# Patient Record
Sex: Male | Born: 1960 | Race: White | Hispanic: No | Marital: Married | Smoking: Current every day smoker
Health system: Southern US, Community
[De-identification: ages and names within clinical notes are randomized; demographics above are authoritative.]

## PROBLEM LIST (undated history)

## (undated) DIAGNOSIS — I639 Cerebral infarction, unspecified: Secondary | ICD-10-CM

## (undated) DIAGNOSIS — I10 Essential (primary) hypertension: Secondary | ICD-10-CM

## (undated) DIAGNOSIS — G243 Spasmodic torticollis: Secondary | ICD-10-CM

## (undated) DIAGNOSIS — M51369 Other intervertebral disc degeneration, lumbar region without mention of lumbar back pain or lower extremity pain: Secondary | ICD-10-CM

## (undated) DIAGNOSIS — M5136 Other intervertebral disc degeneration, lumbar region: Secondary | ICD-10-CM

## (undated) HISTORY — PX: SPINAL CORD STIMULATOR IMPLANT: SHX2422

---

## 2013-09-07 ENCOUNTER — Observation Stay (HOSPITAL_COMMUNITY)
Admission: EM | Admit: 2013-09-07 | Discharge: 2013-09-08 | Disposition: A | Discharge status: Home / Self Care | Payer: Medicare Other | Attending: Internal Medicine | Admitting: Internal Medicine

## 2013-09-07 ENCOUNTER — Emergency Department (HOSPITAL_COMMUNITY): Payer: Medicare Other

## 2013-09-07 ENCOUNTER — Encounter (HOSPITAL_COMMUNITY): Payer: Self-pay | Admitting: Emergency Medicine

## 2013-09-07 DIAGNOSIS — R209 Unspecified disturbances of skin sensation: Secondary | ICD-10-CM | POA: Diagnosis not present

## 2013-09-07 DIAGNOSIS — M51379 Other intervertebral disc degeneration, lumbosacral region without mention of lumbar back pain or lower extremity pain: Secondary | ICD-10-CM | POA: Insufficient documentation

## 2013-09-07 DIAGNOSIS — Z8673 Personal history of transient ischemic attack (TIA), and cerebral infarction without residual deficits: Secondary | ICD-10-CM | POA: Insufficient documentation

## 2013-09-07 DIAGNOSIS — M5136 Other intervertebral disc degeneration, lumbar region: Secondary | ICD-10-CM | POA: Diagnosis present

## 2013-09-07 DIAGNOSIS — F17200 Nicotine dependence, unspecified, uncomplicated: Secondary | ICD-10-CM | POA: Diagnosis not present

## 2013-09-07 DIAGNOSIS — M51369 Other intervertebral disc degeneration, lumbar region without mention of lumbar back pain or lower extremity pain: Secondary | ICD-10-CM

## 2013-09-07 DIAGNOSIS — M5137 Other intervertebral disc degeneration, lumbosacral region: Secondary | ICD-10-CM | POA: Insufficient documentation

## 2013-09-07 DIAGNOSIS — M5382 Other specified dorsopathies, cervical region: Secondary | ICD-10-CM | POA: Insufficient documentation

## 2013-09-07 DIAGNOSIS — Z79899 Other long term (current) drug therapy: Secondary | ICD-10-CM | POA: Insufficient documentation

## 2013-09-07 DIAGNOSIS — R61 Generalized hyperhidrosis: Secondary | ICD-10-CM | POA: Insufficient documentation

## 2013-09-07 DIAGNOSIS — R5381 Other malaise: Secondary | ICD-10-CM | POA: Insufficient documentation

## 2013-09-07 DIAGNOSIS — Z720 Tobacco use: Secondary | ICD-10-CM | POA: Diagnosis present

## 2013-09-07 DIAGNOSIS — R1310 Dysphagia, unspecified: Secondary | ICD-10-CM | POA: Diagnosis not present

## 2013-09-07 DIAGNOSIS — G459 Transient cerebral ischemic attack, unspecified: Secondary | ICD-10-CM

## 2013-09-07 DIAGNOSIS — R5383 Other fatigue: Secondary | ICD-10-CM | POA: Diagnosis present

## 2013-09-07 DIAGNOSIS — G243 Spasmodic torticollis: Secondary | ICD-10-CM

## 2013-09-07 DIAGNOSIS — I10 Essential (primary) hypertension: Secondary | ICD-10-CM

## 2013-09-07 DIAGNOSIS — R202 Paresthesia of skin: Secondary | ICD-10-CM

## 2013-09-07 DIAGNOSIS — R200 Anesthesia of skin: Secondary | ICD-10-CM

## 2013-09-07 HISTORY — DX: Essential (primary) hypertension: I10

## 2013-09-07 HISTORY — DX: Other intervertebral disc degeneration, lumbar region without mention of lumbar back pain or lower extremity pain: M51.369

## 2013-09-07 HISTORY — DX: Spasmodic torticollis: G24.3

## 2013-09-07 HISTORY — DX: Cerebral infarction, unspecified: I63.9

## 2013-09-07 HISTORY — DX: Other intervertebral disc degeneration, lumbar region: M51.36

## 2013-09-07 LAB — I-STAT CHEM 8, ED
BUN: 12 mg/dL (ref 6–23)
CHLORIDE: 103 meq/L (ref 96–112)
Calcium, Ion: 1.26 mmol/L — ABNORMAL HIGH (ref 1.12–1.23)
Creatinine, Ser: 1 mg/dL (ref 0.50–1.35)
GLUCOSE: 95 mg/dL (ref 70–99)
HEMATOCRIT: 46 % (ref 39.0–52.0)
Hemoglobin: 15.6 g/dL (ref 13.0–17.0)
Potassium: 3.9 mEq/L (ref 3.7–5.3)
Sodium: 145 mEq/L (ref 137–147)
TCO2: 25 mmol/L (ref 0–100)

## 2013-09-07 LAB — BASIC METABOLIC PANEL
BUN: 12 mg/dL (ref 6–23)
CO2: 26 mEq/L (ref 19–32)
Calcium: 9.5 mg/dL (ref 8.4–10.5)
Chloride: 106 mEq/L (ref 96–112)
Creatinine, Ser: 0.91 mg/dL (ref 0.50–1.35)
GFR calc non Af Amer: >90 mL/min (ref >90)
GLUCOSE: 97 mg/dL (ref 70–99)
Potassium: 4.1 mEq/L (ref 3.7–5.3)
SODIUM: 142 meq/L (ref 137–147)

## 2013-09-07 LAB — CBC
HCT: 42.9 % (ref 39.0–52.0)
HEMOGLOBIN: 14.7 g/dL (ref 13.0–17.0)
MCH: 30.6 pg (ref 26.0–34.0)
MCHC: 34.3 g/dL (ref 30.0–36.0)
MCV: 89.2 fL (ref 78.0–100.0)
Platelets: 237 10*3/uL (ref 150–400)
RBC: 4.81 MIL/uL (ref 4.22–5.81)
RDW: 12.8 % (ref 11.5–15.5)
WBC: 9.6 10*3/uL (ref 4.0–10.5)

## 2013-09-07 MED ORDER — SODIUM CHLORIDE 0.9 % IV BOLUS (SEPSIS)
1000.0000 mL | Freq: Once | INTRAVENOUS | Status: AC
  Administered 2013-09-07: 1000 mL via INTRAVENOUS

## 2013-09-07 NOTE — ED Provider Notes (Signed)
  Face-to-face evaluation   History: The patient had sudden onset of tingling in right face, and headache, earlier today, after working outside. His wife was with him at the time and denies a finding of dysarthria or facial asymmetry. 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  Physical exam: Alert, calm, cooperative. He has mild, right facial droop, which his wife says is chronic. He has normal sensation to light touch on both sides of the face.  Assessment: TIA, without clinical evidence of CVA. He will need to be admitted for further evaluation.  Medical screening examination/treatment/procedure(s) were conducted as a shared visit with non-physician practitioner(s) and myself.  I personally evaluated the patient during the encounter  Flint MelterElliott L Elijiah Mickley, MD 09/09/13 1108

## 2013-09-07 NOTE — ED Provider Notes (Signed)
CSN: [id number]     Arrival date & time 09/07/13  1913 History   First MD Initiated Contact with Patient 09/07/13 2150     Chief Complaint  Patient presents with  . Dysphagia  . Weakness     (Consider location/radiation/quality/duration/timing/severity/associated sxs/prior Treatment) HPI  Stroke at age 89- unknown cause Nerve root stimulator for chronic back pain  Patient to the ER after an episode of feeling generally weak. He worked outside for a little more than on an [employer] unit under the house. Drank lots of fluids " sweet tea" and ate some food today. Denies drinking water. After he was done working, he went back inside and after sitting down for a while he developed right sided facial tingling. He says he was able to swallow, talk and his wife denies facial drop. I have reviewed the nurses triage note and the note of the EMT and asked the patient this question again, wife denies noticing any asymmetry and the patient denies being unable to swallow or speak. Before his arrival to the ED his symptoms have completely resolved his symptoms in total lasted approx 1 hour. He denies being unable to walk, use his arms and legs, denies change in vision or any significant changes.  Past Medical History  Diagnosis Date  . Stroke   . Hypertension   . DDD (degenerative disc disease), lumbar   . Cervical dystonia    Past Surgical History  Procedure Laterality Date  . Spinal cord stimulator implant     Family History  Problem Relation Age of Onset  . Diabetes Mother    History  Substance Use Topics  . Smoking status: Current Every Day Smoker -- 2.00 packs/day    Types: Cigarettes  . Smokeless tobacco: Never Used  . Alcohol Use: Yes     Comment: occ    Review of Systems   Review of Systems  Gen: no weight loss, fevers, chills, night sweats  Eyes: no discharge or drainage, no occular pain or visual changes  Nose: no epistaxis or rhinorrhea  Mouth: no dental pain, no sore throat   Neck: no neck pain  Lungs:No wheezing, coughing or hemoptysis CV: no chest pain, palpitations, dependent edema or orthopnea  Abd: no abdominal pain, nausea, vomiting, diarrhea GU: no dysuria or gross hematuria  MSK:  No muscle weakness or pain Neuro: no headache, no focal neurologic deficits, + facial tingling and weakness. Skin: no rash or wounds Psyche: no complaints    Allergies  Contrast media  Home Medications   Prior to Admission medications   Medication Sig Start Date End Date Taking? Authorizing Provider  aspirin-acetaminophen-caffeine (EXCEDRIN MIGRAINE) 802 [id number] [id number] MG per tablet Take 2 tablets by mouth every 8 (eight) hours as needed for headache.   Yes Historical Provider, MD  celecoxib (CELEBREX) 200 MG capsule Take 200 mg by mouth daily.   Yes Historical Provider, MD  diazepam (VALIUM) 10 MG tablet Take 10 mg by mouth 2 (two) times daily as needed for anxiety.   Yes Historical Provider, MD  DiphenhydrAMINE HCl (ZZZQUIL) 50 MG/30ML LIQD Take 30 mLs by mouth at bedtime as needed (for sleep).   Yes Historical Provider, MD  HYDROcodone-acetaminophen (NORCO) 10-325 MG per tablet Take 1 tablet by mouth every 6 (six) hours as needed for moderate pain.   Yes Historical Provider, MD  lisinopril (PRINIVIL,ZESTRIL) 20 MG tablet Take 20 mg by mouth daily. 08/20/13  Yes Historical Provider, MD  OnabotulinumtoxinA (BOTOX IJ) Inject 1 application as directed every 3 (  three) months.   Yes Historical Provider, MD  aspirin EC 81 MG tablet Take 1 tablet (81 mg total) by mouth daily. 09/08/13   Esperanza SheetsUlugbek N Buriev, MD   BP 117/68  Pulse 66  Temp(Src) 97.4 F (36.3 C) (Oral)  Resp 20  Ht 6\' 6"  (1.981 m)  Wt 261 lb 8 oz (118.616 kg)  BMI 30.23 kg/m2  SpO2 96% Physical Exam  Nursing note and vitals reviewed. Constitutional: He is oriented to person, place, and time. He appears well-developed and well-nourished. No distress.  HENT:  Head: Normocephalic and atraumatic.  Eyes: Pupils are  equal, round, and reactive to light.  Neck: Normal range of motion. Neck supple.  Cardiovascular: Normal rate and regular rhythm.   Pulmonary/Chest: Effort normal.  Abdominal: Soft.  Neurological: He is alert and oriented to person, place, and time. He has normal strength. No cranial nerve deficit or sensory deficit. GCS eye subscore is 4. GCS verbal subscore is 5. GCS motor subscore is 6.  Skin: Skin is warm and dry.    ED Course  Procedures (including critical care time) Labs Review Labs Reviewed  HEMOGLOBIN A1C - Abnormal; Notable for the following:    Hemoglobin A1C 5.8 (*)    Mean Plasma Glucose 120 (*)    All other components within normal limits  LIPID PANEL - Abnormal; Notable for the following:    HDL 26 (*)    All other components within normal limits  I-STAT CHEM 8, ED - Abnormal; Notable for the following:    Calcium, Ion 1.26 (*)    All other components within normal limits  CBC  BASIC METABOLIC PANEL    Imaging Review Ct Head Wo Contrast  09/07/2013   CLINICAL DATA:  Weakness, difficulty speaking and difficulty swallowing on the right side. Diaphoresis and slight facial droop.  EXAM: CT HEAD WITHOUT CONTRAST  TECHNIQUE: Contiguous axial images were obtained from the base of the skull through the vertex without intravenous contrast.  COMPARISON:  CT of the head performed 05/03/2013  FINDINGS: There is no evidence of acute infarction, mass lesion, or intra- or extra-axial hemorrhage on CT.  The posterior fossa, including the cerebellum, brainstem and fourth ventricle, is within normal limits. The third and lateral ventricles, and basal ganglia are unremarkable in appearance. The cerebral hemispheres are symmetric in appearance, with normal gray-white differentiation. No mass effect or midline shift is seen.  There is no evidence of fracture; visualized osseous structures are unremarkable in appearance. The orbits are within normal limits. Mucosal thickening is noted at the  left side of the sphenoid sinus. The remaining paranasal sinuses and mastoid air cells are well-aerated. No significant soft tissue abnormalities are seen.  IMPRESSION: 1. No acute intracranial pathology seen on CT. 2. Mucosal thickening at the left side of the sphenoid sinus.   Electronically Signed   By: Roanna RaiderJeffery  Chang M.D.   On: 09/07/2013 22:30     EKG Interpretation   Date/Time:  Monday September 07 2013 19:16:18 EDT Ventricular Rate:  83 PR Interval:  175 QRS Duration: 82 QT Interval:  349 QTC Calculation: 410 R Axis:   42 Text Interpretation:  Sinus rhythm Low voltage, precordial leads RSR' in  V1 or V2, probably normal variant No old tracing to compare Confirmed by  [facility] CenterWENTZ  MD, ELLIOTT [phone]([id number]) on 09/07/2013 8:58:25 PM      MDM   Final diagnoses:  TIA (transient ischemic attack)    Pt has had no symptoms since EMS arrived. NO  symptoms in the ED, reports he now feels fine. Wife had called EMS for concern he may have had a "heat stroke".  His labs have all come back reassuring but unfortunately his story of the incident is concerning for TIA. Dr .Effie Shy saw and evaluated the patient as well and feels that it would be best for him to be admitted for TIA work-up. He has a  Spinal cord stimulator device and can not have MRI.  I spoke with [facility] who has agreed to admit.    Dorthula Matas, PA-C 09/08/13 2359

## 2013-09-07 NOTE — ED Notes (Signed)
Per EMS, pt was working outside and suddenly became weak, difficulty speaking and swallowing on R side. EMS reports pt was very diaphoretic, had slight facial droop on their arrival, had equal grips and could move all extremities equally. Facial droop has resolved on arrival to ED. Pt hx of CVA at age 89. Hx of HTN. Had spinal cord stimulator placed in L side of back 1.5 months ago.

## 2013-09-08 ENCOUNTER — Encounter (HOSPITAL_COMMUNITY): Payer: Self-pay | Admitting: Internal Medicine

## 2013-09-08 DIAGNOSIS — M5136 Other intervertebral disc degeneration, lumbar region: Secondary | ICD-10-CM | POA: Diagnosis present

## 2013-09-08 DIAGNOSIS — I10 Essential (primary) hypertension: Secondary | ICD-10-CM | POA: Diagnosis present

## 2013-09-08 DIAGNOSIS — R209 Unspecified disturbances of skin sensation: Secondary | ICD-10-CM

## 2013-09-08 DIAGNOSIS — R202 Paresthesia of skin: Secondary | ICD-10-CM | POA: Diagnosis present

## 2013-09-08 DIAGNOSIS — M5137 Other intervertebral disc degeneration, lumbosacral region: Secondary | ICD-10-CM

## 2013-09-08 DIAGNOSIS — G243 Spasmodic torticollis: Secondary | ICD-10-CM | POA: Diagnosis present

## 2013-09-08 DIAGNOSIS — R200 Anesthesia of skin: Secondary | ICD-10-CM | POA: Diagnosis present

## 2013-09-08 DIAGNOSIS — G459 Transient cerebral ischemic attack, unspecified: Secondary | ICD-10-CM

## 2013-09-08 DIAGNOSIS — Z720 Tobacco use: Secondary | ICD-10-CM | POA: Diagnosis present

## 2013-09-08 DIAGNOSIS — F17200 Nicotine dependence, unspecified, uncomplicated: Secondary | ICD-10-CM

## 2013-09-08 LAB — LIPID PANEL
CHOL/HDL RATIO: 5.7 ratio
Cholesterol: 148 mg/dL (ref 0–200)
HDL: 26 mg/dL — AB (ref >39)
LDL CALC: 97 mg/dL (ref 0–99)
TRIGLYCERIDES: 126 mg/dL (ref <150)
VLDL: 25 mg/dL (ref 0–40)

## 2013-09-08 LAB — HEMOGLOBIN A1C
Hgb A1c MFr Bld: 5.8 % — ABNORMAL HIGH (ref <5.7)
MEAN PLASMA GLUCOSE: 120 mg/dL — AB (ref <117)

## 2013-09-08 MED ORDER — DIAZEPAM 5 MG PO TABS: 10.0000 mg | ORAL_TABLET | Freq: Two times a day (BID) | ORAL | Status: DC | PRN

## 2013-09-08 MED ORDER — ASPIRIN-ACETAMINOPHEN-CAFFEINE 250-250-65 MG PO TABS
1.0000 | ORAL_TABLET | Freq: Once | ORAL | Status: AC
  Administered 2013-09-08: 1 via ORAL
  Filled 2013-09-08: qty 1

## 2013-09-08 MED ORDER — ASPIRIN EC 81 MG PO TBEC: 81.0000 mg | DELAYED_RELEASE_TABLET | Freq: Every day | ORAL | Status: AC

## 2013-09-08 MED ORDER — HYDROCODONE-ACETAMINOPHEN 10-325 MG PO TABS: 1.0000 | ORAL_TABLET | Freq: Four times a day (QID) | ORAL | Status: DC | PRN

## 2013-09-08 MED ORDER — CELECOXIB 200 MG PO CAPS
200.0000 mg | ORAL_CAPSULE | Freq: Every day | ORAL | Status: DC
  Administered 2013-09-08: 200 mg via ORAL
  Filled 2013-09-08: qty 1

## 2013-09-08 MED ORDER — SODIUM CHLORIDE 0.9 % IV SOLN
INTRAVENOUS | Status: AC
  Administered 2013-09-08: 02:00:00 via INTRAVENOUS

## 2013-09-08 MED ORDER — ASPIRIN 325 MG PO TABS
325.0000 mg | ORAL_TABLET | Freq: Every day | ORAL | Status: DC
  Administered 2013-09-08: 325 mg via ORAL
  Filled 2013-09-08: qty 1

## 2013-09-08 MED ORDER — ENOXAPARIN SODIUM 40 MG/0.4ML ~~LOC~~ SOLN
40.0000 mg | Freq: Every day | SUBCUTANEOUS | Status: DC
Start: 2013-09-08 — End: 2013-09-08
  Filled 2013-09-08: qty 0.4

## 2013-09-08 MED ORDER — ACETAMINOPHEN 325 MG PO TABS: 650.0000 mg | ORAL_TABLET | ORAL | Status: DC | PRN

## 2013-09-08 NOTE — Progress Notes (Signed)
[facility] PROGRESS NOTE  Jared Mclaughlin MVH:[id number] DOB: 1960/05/06 DOA: 09/07/2013 PCP: PROVIDER NOT IN SYSTEM  Assessment/Plan: 53 y.o. male h/o tobacco use, hypertension presents with right face tingling while out in the heat. Per wife, became extremely diaphoretic, weak, voice lowered to a whisper. When he went inside, had difficulty drinking iced tea. EMS noted right facial droop. All symptoms now resolved and patient back to baseline. Reports having had a "stroke" at age 89 with left hemiplegia, hospitalized for weeks, no residual deficits. Has spinal cord stimulator.  1. Numbness and tingling of right face:  -symptoms resolved, neuro exam no focal; CT head unremarkable; Cannot get MRI due to spinal cord stimulator.  -Start asa 325. Pend Echo, carotid dopplers, hgb a1c.  -? Heat related symptoms; recommended hydration   2. Benign hypertension: hold lisinopril for now  3. Cervical dystonia, DDD (degenerative disc disease), lumbar  -s/p lumbar stimulator; no change; cont outpatient follow up  4. Tobacco abuse declines patch  Code Status: full; Family Communication:  D/w patient, his wife (indicate person spoken with, relationship, and if by phone, the number) Disposition Plan: home 24-48 hrs    Consultants:  none  Procedures:  Pend echo   Antibiotics:  none (indicate start date, and stop date if known)  HPI/Subjective: alert  Objective: Filed Vitals:   09/08/13 1002  BP: 111/75  Pulse: 58  Temp: 97.6 F (36.4 C)  Resp: 20    Intake/Output Summary (Last 24 hours) at 09/08/13 1048 Last data filed at 09/08/13 0804  Gross per 24 hour  Intake   2360 ml  Output      0 ml  Net   2360 ml   Filed Weights   09/07/13 1923 09/08/13 0106  Weight: 115.667 kg (255 lb) 118.616 kg (261 lb 8 oz)    Exam:   General:  alert  Cardiovascular: s1,s2 rrr  Respiratory: CAT BL  Abdomen: soft, nt,nd   Musculoskeletal: no LE edema   Data Reviewed: Basic  Metabolic Panel:  Recent Labs Lab 09/07/13 2120 09/07/13 2131  NA 142 145  K 4.1 3.9  CL 106 103  CO2 26  --   GLUCOSE 97 95  BUN 12 12  CREATININE 0.91 1.00  CALCIUM 9.5  --    Liver Function Tests: No results found for this basename: AST, ALT, ALKPHOS, BILITOT, PROT, ALBUMIN,  in the last 168 hours No results found for this basename: LIPASE, AMYLASE,  in the last 168 hours No results found for this basename: AMMONIA,  in the last 168 hours CBC:  Recent Labs Lab 09/07/13 2120 09/07/13 2131  WBC 9.6  --   HGB 14.7 15.6  HCT 42.9 46.0  MCV 89.2  --   PLT 237  --    Cardiac Enzymes: No results found for this basename: CKTOTAL, CKMB, CKMBINDEX, TROPONINI,  in the last 168 hours BNP (last 3 results) No results found for this basename: PROBNP,  in the last 8760 hours CBG: No results found for this basename: GLUCAP,  in the last 168 hours  No results found for this or any previous visit (from the past 240 hour(s)).   Studies: Ct Head Wo Contrast  09/07/2013   CLINICAL DATA:  Weakness, difficulty speaking and difficulty swallowing on the right side. Diaphoresis and slight facial droop.  EXAM: CT HEAD WITHOUT CONTRAST  TECHNIQUE: Contiguous axial images were obtained from the base of the skull through the vertex without intravenous contrast.  COMPARISON:  CT of the  head performed 05/03/2013  FINDINGS: There is no evidence of acute infarction, mass lesion, or intra- or extra-axial hemorrhage on CT.  The posterior fossa, including the cerebellum, brainstem and fourth ventricle, is within normal limits. The third and lateral ventricles, and basal ganglia are unremarkable in appearance. The cerebral hemispheres are symmetric in appearance, with normal gray-white differentiation. No mass effect or midline shift is seen.  There is no evidence of fracture; visualized osseous structures are unremarkable in appearance. The orbits are within normal limits. Mucosal thickening is noted at the  left side of the sphenoid sinus. The remaining paranasal sinuses and mastoid air cells are well-aerated. No significant soft tissue abnormalities are seen.  IMPRESSION: 1. No acute intracranial pathology seen on CT. 2. Mucosal thickening at the left side of the sphenoid sinus.   Electronically Signed   By: Roanna RaiderJeffery  Chang M.D.   On: 09/07/2013 22:30    Scheduled Meds: . sodium chloride   Intravenous STAT  . aspirin  325 mg Oral Daily  . celecoxib  200 mg Oral Daily  . enoxaparin (LOVENOX) injection  40 mg Subcutaneous Daily   Continuous Infusions:   Principal Problem:   Numbness and tingling of right face Active Problems:   Benign hypertension   Cervical dystonia   DDD (degenerative disc disease), lumbar   Tobacco abuse    Time spent: >35 minutes     [name], Jared Mclaughlin  [facility] Pager [phone]. If 7PM-7AM, please contact night-coverage at [email or web address], password [email or web address] 09/08/2013, 10:48 AM  LOS: 1 day

## 2013-09-08 NOTE — ED Notes (Addendum)
Dr Lendell CapriceSullivan gave pt Jared Mclaughlin. Will follow up with swallow screen.   I did not give the patient a Mclaughlin. Crista Curborinna Thais Silberstein, MD

## 2013-09-08 NOTE — H&P (Signed)
[facility] History and Physical  Jared ClarityRichard Mclaughlin WGN:[id number] DOB: 07-26-1960 DOA: 09/07/2013  Referring physician: EDP PCP: PROVIDER NOT IN SYSTEM   Chief Complaint: right face tingling  HPI: Jared Mclaughlin is a 53 y.o. male  With h/o tobacco use, hypertension presents with right face tingling while out in the heat.  Per wife, became extremely diaphoretic, weak, voice lowered to a whisper.  When he went inside, had difficulty drinking iced tea.  EMS noted right facial droop.  All symptoms now resolved and patient back to baseline.  Reports having had a "stroke" at age 89 with left hemiplegia, hospitalized for weeks, no residual deficits.  Has spinal cord stimulator.   Review of Systems:  Constitutional:  No weight loss, night sweats, Fevers, chills, fatigue.  HEENT:  No headaches, Difficulty swallowing,Tooth/dental problems,Sore throat,  No sneezing, itching, ear ache, nasal congestion, post nasal drip,  Cardio-vascular:  No chest pain, Orthopnea, PND, swelling in lower extremities, anasarca, dizziness, palpitations  GI:  No heartburn, indigestion, abdominal pain, nausea, vomiting, diarrhea, change in bowel habits, loss of appetite  Resp:  No shortness of breath with exertion or at rest. No excess mucus, no productive cough, No non-productive cough, No coughing up of blood.No change in color of mucus.No wheezing.No chest wall deformity  Skin:  no rash or lesions.  GU:  no dysuria, change in color of urine, no urgency or frequency. No flank pain.  Musculoskeletal:  No joint pain or swelling. No decreased range of motion. No back pain.  Psych:  No change in mood or affect. No depression or anxiety. No memory loss.   Past Medical History  Diagnosis Date  . Stroke   . Hypertension   . DDD (degenerative disc disease), lumbar   . Cervical dystonia    Past Surgical History  Procedure Laterality Date  . Spinal cord stimulator implant     Social History: smokes 1 PPD,  down from 2 PPD. Denies drinking or drugs  Allergies  Allergen Reactions  . Contrast Media [Iodinated Diagnostic Agents] Other (See Comments)    Cardiac arrest    Family History  Problem Relation Age of Onset  . Diabetes Mother      Prior to Admission medications   Medication Sig Start Date End Date Taking? Authorizing Provider  aspirin-acetaminophen-caffeine (EXCEDRIN MIGRAINE) 7723085893250-250-65 MG per tablet Take 2 tablets by mouth every 8 (eight) hours as needed for headache.   Yes Historical Provider, MD  celecoxib (CELEBREX) 200 MG capsule Take 200 mg by mouth daily.   Yes Historical Provider, MD  diazepam (VALIUM) 10 MG tablet Take 10 mg by mouth 2 (two) times daily as needed for anxiety.   Yes Historical Provider, MD  DiphenhydrAMINE HCl (ZZZQUIL) 50 MG/30ML LIQD Take 30 mLs by mouth at bedtime as needed (for sleep).   Yes Historical Provider, MD  HYDROcodone-acetaminophen (NORCO) 10-325 MG per tablet Take 1 tablet by mouth every 6 (six) hours as needed for moderate pain.   Yes Historical Provider, MD  lisinopril (PRINIVIL,ZESTRIL) 20 MG tablet Take 20 mg by mouth daily. 08/20/13  Yes Historical Provider, MD  OnabotulinumtoxinA (BOTOX IJ) Inject 1 application as directed every 3 (three) months.   Yes Historical Provider, MD   Physical Exam: Filed Vitals:   09/08/13 0030  BP: 113/76  Pulse: 71  Temp:   Resp: 20    BP 113/76  Pulse 71  Temp(Src) 99 F (37.2 C)  Resp 20  Ht 6\' 6"  (1.981 m)  Wt 115.667 kg (255  lb)  BMI 29.47 kg/m2  SpO2 100%  BP 113/76  Pulse 71  Temp(Src) 99 F (37.2 C)  Resp 20  Ht 6\' 6"  (1.981 m)  Wt 115.667 kg (255 lb)  BMI 29.47 kg/m2  SpO2 100%  General Appearance:    Alert, cooperative, no distress, appears stated age  Head:    Normocephalic, without obvious abnormality, atraumatic  Eyes:    PERRL, conjunctiva/corneas clear, EOM's intact, fundi    benign, both eyes          Nose:   Nares normal, septum midline, mucosa normal, no drainage    or sinus tenderness  Throat:   Lips, mucosa, and tongue normal; teeth and gums normal  Neck:   Supple, symmetrical, trachea midline, no adenopathy;       thyroid:  No enlargement/tenderness/nodules; no carotid   bruit or JVD  Back:     Symmetric, no curvature, ROM normal, no CVA tenderness  Lungs:     Clear to auscultation bilaterally, respirations unlabored  Chest wall:    No tenderness or deformity  Heart:    Regular rate and rhythm, S1 and S2 normal, no murmur, rub   or gallop  Abdomen:     Soft, non-tender, bowel sounds active all four quadrants,    no masses, no organomegaly  Genitalia:   deferred  Rectal:   deferred  Extremities:   Extremities normal, atraumatic, no cyanosis or edema  Pulses:   2+ and symmetric all extremities  Skin:   Skin color, texture, turgor normal, no rashes or lesions  Lymph nodes:   Cervical, supraclavicular, and axillary nodes normal  Neurologic:   Right face with decreased sensation. Cranial nerves otherwise intact. Normal strength, sensation and reflexes      Throughout. Speech clear and fluent. Finger to nose normal          Labs on Admission:  Basic Metabolic Panel:  Recent Labs Lab 09/07/13 2120 09/07/13 2131  NA 142 145  K 4.1 3.9  CL 106 103  CO2 26  --   GLUCOSE 97 95  BUN 12 12  CREATININE 0.91 1.00  CALCIUM 9.5  --    Liver Function Tests: No results found for this basename: AST, ALT, ALKPHOS, BILITOT, PROT, ALBUMIN,  in the last 168 hours No results found for this basename: LIPASE, AMYLASE,  in the last 168 hours No results found for this basename: AMMONIA,  in the last 168 hours CBC:  Recent Labs Lab 09/07/13 2120 09/07/13 2131  WBC 9.6  --   HGB 14.7 15.6  HCT 42.9 46.0  MCV 89.2  --   PLT 237  --    Cardiac Enzymes: No results found for this basename: CKTOTAL, CKMB, CKMBINDEX, TROPONINI,  in the last 168 hours  BNP (last 3 results) No results found for this basename: PROBNP,  in the last 8760 hours CBG: No  results found for this basename: GLUCAP,  in the last 168 hours  Radiological Exams on Admission: Ct Head Wo Contrast  09/07/2013   CLINICAL DATA:  Weakness, difficulty speaking and difficulty swallowing on the right side. Diaphoresis and slight facial droop.  EXAM: CT HEAD WITHOUT CONTRAST  TECHNIQUE: Contiguous axial images were obtained from the base of the skull through the vertex without intravenous contrast.  COMPARISON:  CT of the head performed 05/03/2013  FINDINGS: There is no evidence of acute infarction, mass lesion, or intra- or extra-axial hemorrhage on CT.  The posterior fossa, including the cerebellum, brainstem  and fourth ventricle, is within normal limits. The third and lateral ventricles, and basal ganglia are unremarkable in appearance. The cerebral hemispheres are symmetric in appearance, with normal gray-white differentiation. No mass effect or midline shift is seen.  There is no evidence of fracture; visualized osseous structures are unremarkable in appearance. The orbits are within normal limits. Mucosal thickening is noted at the left side of the sphenoid sinus. The remaining paranasal sinuses and mastoid air cells are well-aerated. No significant soft tissue abnormalities are seen.  IMPRESSION: 1. No acute intracranial pathology seen on CT. 2. Mucosal thickening at the left side of the sphenoid sinus.   Electronically Signed   By: Roanna RaiderJeffery  Chang M.D.   On: 09/07/2013 22:30    EKG: Sinus rhythm Low voltage, precordial leads RSR' in V1 or V2, probably normal variant  Assessment/Plan Principal Problem:   Numbness and tingling of right face:  Cannot get MRI due to spinal cord stimulator. Observation on tele. Start asa 325. Echo, carotid dopplers, lipid profile, hgb a1c. Neuro checks Active Problems:   Benign hypertension: hold lisinopril for now   Cervical dystonia   DDD (degenerative disc disease), lumbar   Tobacco abuse declines patch  Code Status: full Family  Communication: wife at bedside Disposition Plan: home  Time spent: 50 min  SULLIVAN,CORINNA L [facility] Pager [phone]

## 2013-09-08 NOTE — Progress Notes (Signed)
UR Completed.  Mclaughlin, Jared Jane [phone] 09/08/2013  

## 2013-09-08 NOTE — Progress Notes (Signed)
VASCULAR LAB PRELIMINARY  PRELIMINARY  PRELIMINARY  PRELIMINARY  Carotid duplex completed.    Preliminary report:  Bilateral:  1-39% ICA stenosis.  Vertebral artery flow not insonated.     NICHOLS, FRANCES, RVT 09/08/2013, 2:29 PM   .

## 2013-09-08 NOTE — Discharge Summary (Signed)
Physician Discharge Summary  Jared ClarityRichard Mclaughlin ZOX:[id number] DOB: 01/28/1961 DOA: 09/07/2013  PCP: Eisa Conaway NOT IN SYSTEM  Admit date: 09/07/2013 Discharge date: 09/08/2013  Time spent: >35 minutes  Recommendations for Outpatient Follow-up:  F/u with PCP in 1 week  Discharge Diagnoses:  Principal Problem:   Numbness and tingling of right face Active Problems:   Benign hypertension   Cervical dystonia   DDD (degenerative disc disease), lumbar   Tobacco abuse   Discharge Condition: stable   Diet recommendation: low sodium   Filed Weights   09/07/13 1923 09/08/13 0106  Weight: 115.667 kg (255 lb) 118.616 kg (261 lb 8 oz)    History of present illness:  53 y.o. male h/o tobacco use, hypertension presents with right face tingling while out in the heat. Per wife, became extremely diaphoretic, weak, voice lowered to a whisper. When he went inside, had difficulty drinking iced tea. EMS noted right facial droop. All symptoms now resolved and patient back to baseline. 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 Has spinal cord stimulator.   Hospital Course:  1. Numbness and tingling of right face, ? TIA -symptoms completely resolved, neuro exam no focal; CT head unremarkable; Cannot get MRI due to spinal cord stimulator.  -started asa. Echo, carotid dopplers, hgb a1c: unremarkable;   -? Heat related symptoms; recommended hydration  2. Benign hypertension: resume home meds 3. Cervical dystonia, DDD (degenerative disc disease), lumbar  -s/p lumbar stimulator; no change; cont outpatient follow up  4. Tobacco abuse declines patch  Echo Study Conclusions  - Left ventricle: The cavity size was normal. Systolic function was normal. The estimated ejection fraction was in the range of 60% to 65%. Wall motion was normal; there were no regional wall motion abnormalities. Left ventricular diastolic function parameters were  normal.  Impressions:  - No cardiac source of emboli was indentified.     Procedures:  ech (i.e. Studies not automatically included, echos, thoracentesis, etc; not x-rays)  Consultations:  none  Discharge Exam: Filed Vitals:   09/08/13 1453  BP: 117/68  Pulse: 66  Temp: 97.4 F (36.3 C)  Resp: 20    General: alert Cardiovascular: s1,s2 rrr Respiratory: CTA BL  Discharge Instructions  Discharge Instructions   Diet - low sodium heart healthy    Complete by:  As directed      Discharge instructions    Complete by:  As directed   Please follow up with primary care doctor in 1 week as needed     Increase activity slowly    Complete by:  As directed             Medication List         aspirin EC 81 MG tablet  Take 1 tablet (81 mg total) by mouth daily.     aspirin-acetaminophen-caffeine 250-250-65 MG per tablet  Commonly known as:  EXCEDRIN MIGRAINE  Take 2 tablets by mouth every 8 (eight) hours as needed for headache.     BOTOX IJ  Inject 1 application as directed every 3 (three) months.     celecoxib 200 MG capsule  Commonly known as:  CELEBREX  Take 200 mg by mouth daily.     diazepam 10 MG tablet  Commonly known as:  VALIUM  Take 10 mg by mouth 2 (two) times daily as needed for anxiety.     HYDROcodone-acetaminophen 10-325 MG per tablet  Commonly known as:  NORCO  Take 1  tablet by mouth every 6 (six) hours as needed for moderate pain.     lisinopril 20 MG tablet  Commonly known as:  PRINIVIL,ZESTRIL  Take 20 mg by mouth daily.     ZZZQUIL 50 MG/30ML Liqd  Generic drug:  DiphenhydrAMINE HCl  Take 30 mLs by mouth at bedtime as needed (for sleep).       Allergies  Allergen Reactions  . Contrast Media [Iodinated Diagnostic Agents] Other (See Comments)    Cardiac arrest       Follow-up Information   Follow up with Araina Butrick NOT IN SYSTEM.      Follow up with [facility] In 1 week.   Contact information:    [address] [place] [id number] [phone]       The results of significant diagnostics from this hospitalization (including imaging, microbiology, ancillary and laboratory) are listed below for reference.    Significant Diagnostic Studies: Ct Head Wo Contrast  09/07/2013   CLINICAL DATA:  Weakness, difficulty speaking and difficulty swallowing on the right side. Diaphoresis and slight facial droop.  EXAM: CT HEAD WITHOUT CONTRAST  TECHNIQUE: Contiguous axial images were obtained from the base of the skull through the vertex without intravenous contrast.  COMPARISON:  CT of the head performed 05/03/2013  FINDINGS: There is no evidence of acute infarction, mass lesion, or intra- or extra-axial hemorrhage on CT.  The posterior fossa, including the cerebellum, brainstem and fourth ventricle, is within normal limits. The third and lateral ventricles, and basal ganglia are unremarkable in appearance. The cerebral hemispheres are symmetric in appearance, with normal gray-white differentiation. No mass effect or midline shift is seen.  There is no evidence of fracture; visualized osseous structures are unremarkable in appearance. The orbits are within normal limits. Mucosal thickening is noted at the left side of the sphenoid sinus. The remaining paranasal sinuses and mastoid air cells are well-aerated. No significant soft tissue abnormalities are seen.  IMPRESSION: 1. No acute intracranial pathology seen on CT. 2. Mucosal thickening at the left side of the sphenoid sinus.   Electronically Signed   By: Roanna RaiderJeffery  Chang M.D.   On: 09/07/2013 22:30    Microbiology: No results found for this or any previous visit (from the past 240 hour(s)).   Labs: Basic Metabolic Panel:  Recent Labs Lab 09/07/13 2120 09/07/13 2131  NA 142 145  K 4.1 3.9  CL 106 103  CO2 26  --   GLUCOSE 97 95  BUN 12 12  CREATININE 0.91 1.00  CALCIUM 9.5  --    Liver Function Tests: No results found for this  basename: AST, ALT, ALKPHOS, BILITOT, PROT, ALBUMIN,  in the last 168 hours No results found for this basename: LIPASE, AMYLASE,  in the last 168 hours No results found for this basename: AMMONIA,  in the last 168 hours CBC:  Recent Labs Lab 09/07/13 2120 09/07/13 2131  WBC 9.6  --   HGB 14.7 15.6  HCT 42.9 46.0  MCV 89.2  --   PLT 237  --    Cardiac Enzymes: No results found for this basename: CKTOTAL, CKMB, CKMBINDEX, TROPONINI,  in the last 168 hours BNP: BNP (last 3 results) No results found for this basename: PROBNP,  in the last 8760 hours CBG: No results found for this basename: GLUCAP,  in the last 168 hours     Signed:  [name], ULUGBEK N  [facility] 09/08/2013, 4:35 PM

## 2013-10-14 ENCOUNTER — Institutional Professional Consult (permissible substitution): Payer: Medicare Other | Admitting: Cardiology

## 2013-11-16 ENCOUNTER — Encounter: Payer: Self-pay | Admitting: Cardiology

## 2013-11-16 ENCOUNTER — Ambulatory Visit (INDEPENDENT_AMBULATORY_CARE_PROVIDER_SITE_OTHER): Payer: Medicare Other | Admitting: Cardiology

## 2013-11-16 VITALS — BP 130/90 | HR 82 | Ht 78.0 in | Wt 260.0 lb

## 2013-11-16 DIAGNOSIS — R0789 Other chest pain: Secondary | ICD-10-CM

## 2013-11-16 DIAGNOSIS — R079 Chest pain, unspecified: Secondary | ICD-10-CM | POA: Insufficient documentation

## 2013-11-16 DIAGNOSIS — I119 Hypertensive heart disease without heart failure: Secondary | ICD-10-CM

## 2013-11-16 NOTE — Progress Notes (Signed)
Edythe Clarity Date of Birth:  [birth year] [facility] [address] [place], [place]  [address] [phone]        Fax   [phone]  History of present illness:  This pleasant 53 year old gentleman is seen at the request of Dr. Lutricia Horsfall, his PCP from [facility].  He is being seen for evaluation of exertional weakness and exertional chest discomfort.  He does not have any history of known ischemic heart disease.  He was hospitalized briefly at [facility] in 09/07/2013 until 09/08/2013 42 describes as heat exhaustion.  He has a past history of hypertension, cervical dystonia, degenerative disc disease, and tobacco abuse.  He has a history of having had a stroke at age 87 which caused him to be weak on his left side.  The patient has been retired on medical disability since 2006 because of back problems.  He has a spinal cord stimulator in place which was implanted in 2015 in [facility].  For his cervical dystonia he receives Botox injections into his neck every 3 months. The patient smokes a pack of cigarettes a day.  He does not drink any alcohol.  He does not do cocaine or illicit drugs. His family history reveals that his mother died recently at age 38 of "fatty heart". His father died at a young age of uncertain cause.  Current Outpatient Prescriptions  Medication Sig Dispense Refill  . aspirin EC 81 MG tablet Take 1 tablet (81 mg total) by mouth daily.      Marland Kitchen aspirin-acetaminophen-caffeine (EXCEDRIN MIGRAINE) 250-250-65 MG per tablet Take 2 tablets by mouth every 8 (eight) hours as needed for headache.      . celecoxib (CELEBREX) 200 MG capsule Take 200 mg by mouth daily.      . diazepam (VALIUM) 10 MG tablet Take 10 mg by mouth 2 (two) times daily as needed for anxiety.      . DiphenhydrAMINE HCl (ZZZQUIL) 50 MG/30ML LIQD Take 30 mLs by mouth at bedtime as needed (for sleep).      Marland Kitchen HYDROcodone-acetaminophen (NORCO) 10-325 MG per  tablet Take 1 tablet by mouth every 6 (six) hours as needed for moderate pain.      Marland Kitchen lisinopril (PRINIVIL,ZESTRIL) 20 MG tablet Take 20 mg by mouth daily.      . OnabotulinumtoxinA (BOTOX IJ) Inject 1 application as directed every 3 (three) months.       No current facility-administered medications for this visit.    Allergies  Allergen Reactions  . Contrast Media [Iodinated Diagnostic Agents] Other (See Comments)    Cardiac arrest    Patient Active Problem List   Diagnosis Date Noted  . Chest pain of uncertain etiology 11/16/2013  . Numbness and tingling of right face 09/08/2013  . Benign hypertension 09/08/2013  . Cervical dystonia 09/08/2013  . DDD (degenerative disc disease), lumbar 09/08/2013  . Tobacco abuse 09/08/2013    History  Smoking status  . Current Every Day Smoker -- 2.00 packs/day  . Types: Cigarettes  Smokeless tobacco  . Never Used    History  Alcohol Use  . Yes    Comment: occ    Family History  Problem Relation Age of Onset  . Diabetes Mother     Review of Systems: Constitutional: no fever chills diaphoresis or fatigue or change in weight.  Head and neck: no hearing loss, no epistaxis, no photophobia or visual disturbance. Respiratory: No cough, shortness of breath or  wheezing. Cardiovascular: No chest pain peripheral edema, palpitations. Gastrointestinal: No abdominal distention, no abdominal pain, no change in bowel habits hematochezia or melena. Genitourinary: No dysuria, no frequency, no urgency, no nocturia. Musculoskeletal:No arthralgias, no back pain, no gait disturbance or myalgias. Neurological: No dizziness, no headaches, no numbness, no seizures, no syncope, no weakness, no tremors. Hematologic: No lymphadenopathy, no easy bruising. Psychiatric: No confusion, no hallucinations, no sleep disturbance.    Physical Exam: Filed Vitals:   11/16/13 1451  BP: 130/90  Pulse: 82   the general appearance reveals a large middle-aged  gentleman in no distress.The head and neck exam reveals pupils equal and reactive.  Extraocular movements are full.  There is no scleral icterus.  The mouth and pharynx are normal.  The neck is supple.  The carotids reveal no bruits.  The jugular venous pressure is normal.  The  thyroid is not enlarged.  There is no lymphadenopathy.  The chest is clear to percussion and auscultation.  There are no rales or rhonchi.  Expansion of the chest is symmetrical.  The precordium is quiet.  The first heart sound is normal.  The second heart sound is physiologically split.  There is no murmur gallop rub or click.  There is no abnormal lift or heave.  The abdomen is soft and nontender.  The bowel sounds are normal.  The liver and spleen are not enlarged.  There are no abdominal masses.  There are no abdominal bruits.  Extremities reveal good pedal pulses.  There is no phlebitis or edema.  There is no cyanosis or clubbing.  Strength is normal and symmetrical in all extremities.  There is no lateralizing weakness.  There are no sensory deficits.  The skin is warm and dry.  There is no rash.  EKG at rest shows normal sinus rhythm and is within normal limits.   Assessment / Plan: 1. exertional dyspnea fatigue and chest discomfort of uncertain etiology 2. tobacco abuse 3. Hypertension 4. chronic back pain with an implanted spinal cord stimulator in the thoracic spine. 5. history of cervical dystonia for which he gets quarterly Botox injections  Disposition: The patient will return for a Lexiscan Myoview stress test to exclude myocardial ischemia as a cause of his chest symptoms and fatigue. The patient had recent lab work in June 2015 which was negative for anemia or hypothyroidism.  His echocardiogram at that time showed normal LV systolic function with ejection fraction 60-65%. Many thanks for the opportunity to see this pleasant gentleman with you.  I will be in touch with her regarding the results of his stress  test.

## 2013-11-16 NOTE — Patient Instructions (Signed)
Your physician recommends that you continue on your current medications as directed. Please refer to the Current Medication list given to you today. Your physician has requested that you have a lexiscan myoview. For further information please visit [email or web address]. Please follow instruction sheet, as given.  

## 2013-11-26 ENCOUNTER — Ambulatory Visit (HOSPITAL_COMMUNITY): Payer: Medicare Other | Attending: Cardiology | Admitting: Radiology

## 2013-11-26 VITALS — BP 119/93 | Ht 78.0 in | Wt 260.0 lb

## 2013-11-26 DIAGNOSIS — R5383 Other fatigue: Secondary | ICD-10-CM | POA: Diagnosis not present

## 2013-11-26 DIAGNOSIS — I2510 Atherosclerotic heart disease of native coronary artery without angina pectoris: Secondary | ICD-10-CM | POA: Diagnosis not present

## 2013-11-26 DIAGNOSIS — R0609 Other forms of dyspnea: Secondary | ICD-10-CM | POA: Insufficient documentation

## 2013-11-26 DIAGNOSIS — R5381 Other malaise: Secondary | ICD-10-CM | POA: Diagnosis not present

## 2013-11-26 DIAGNOSIS — R0989 Other specified symptoms and signs involving the circulatory and respiratory systems: Secondary | ICD-10-CM | POA: Insufficient documentation

## 2013-11-26 DIAGNOSIS — I10 Essential (primary) hypertension: Secondary | ICD-10-CM | POA: Insufficient documentation

## 2013-11-26 DIAGNOSIS — R0789 Other chest pain: Secondary | ICD-10-CM | POA: Diagnosis not present

## 2013-11-26 DIAGNOSIS — R079 Chest pain, unspecified: Secondary | ICD-10-CM

## 2013-11-26 DIAGNOSIS — I69998 Other sequelae following unspecified cerebrovascular disease: Secondary | ICD-10-CM | POA: Insufficient documentation

## 2013-11-26 DIAGNOSIS — R0602 Shortness of breath: Secondary | ICD-10-CM

## 2013-11-26 MED ORDER — TECHNETIUM TC 99M SESTAMIBI GENERIC - CARDIOLITE
30.0000 | Freq: Once | INTRAVENOUS | Status: AC | PRN
  Administered 2013-11-26: 30 via INTRAVENOUS

## 2013-11-26 MED ORDER — TECHNETIUM TC 99M SESTAMIBI GENERIC - CARDIOLITE
10.0000 | Freq: Once | INTRAVENOUS | Status: AC | PRN
  Administered 2013-11-26: 10 via INTRAVENOUS

## 2013-11-26 MED ORDER — REGADENOSON 0.4 MG/5ML IV SOLN
0.4000 mg | Freq: Once | INTRAVENOUS | Status: AC
  Administered 2013-11-26: 0.4 mg via INTRAVENOUS

## 2013-11-26 NOTE — Progress Notes (Signed)
[facility] SITE 3 NUCLEAR MED [address] [place], [place] [address] [phone]    Cardiology Nuclear Med Study  Jared Mclaughlin is a 53 y.o. male     MRN : [id number]     DOB: [birth year]  Procedure Date: 11/26/2013  Nuclear Med Background Indication for Stress Test:  Evaluation for Ischemia History:No Known History of CAD Cardiac Risk Factors: Carotid Disease, CVA with residual left sided weakness, and Hypertension  Symptoms:  Chest Pain with Exertion (last date of chest discomfort 4 days ago )DOE,FATIGUE   Nuclear Pre-Procedure Caffeine/Decaff Intake:  None> 12 hrs NPO After: 8:00pm   Lungs:  clear O2 Sat: 95% on room air. IV 0.9% NS with Angio Cath:  20g  IV Site: R Antecubital x 1, tolerated well IV Started by:  Irean Hong, RN  Chest Size (in):  46 Cup Size: n/a  Height:  (1.981 m)  Weight:  260 lb (117.935 kg)  BMI:  Body mass index is 30.05 kg/(m^2). Tech Comments:  Patient took Norco and Lisinopril this am. No Excedrin > 24 hrs. Irean Hong, RN.    Nuclear Med Study 1 or 2 day study: 1 day  Stress Test Type:  Eugenie Birks  Reading MD: N/A  Order Authorizing Provider:  Cassell Clement, MD  Resting Radionuclide: Technetium 34m Sestamibi  Resting Radionuclide Dose: 11.0 mCi   Stress Radionuclide:  Technetium 12m Sestamibi  Stress Radionuclide Dose: 33.0 mCi           Stress Protocol Rest HR: 78 Stress HR: 111  Rest BP: 119/93 Stress BP: 134/53  Exercise Time (min): n/a METS: n/a   Predicted Max HR: 170 bpm % Max HR: 65.29 bpm Rate Pressure Product: 21308   Dose of Adenosine (mg):  n/a Dose of Lexiscan: 0.4 mg  Dose of Atropine (mg): n/a Dose of Dobutamine: n/a mcg/kg/min (at max HR)  Stress Test Technologist: Frederick Peers, EMT-P  Nuclear Technologist:  Doyne Keel, CNMT     Rest Procedure:  Myocardial perfusion imaging was performed at rest 45 minutes following the intravenous administration of Technetium 59m Sestamibi. Rest ECG: Sinus  rhythm, 75, poor R wave progression  Stress Procedure:  The patient received IV Lexiscan 0.4 mg over 15-seconds.  Technetium 9m Sestamibi injected at 30-seconds.  Quantitative spect images were obtained after a 45 minute delay. Stress ECG: No significant change from baseline ECG  QPS Raw Data Images:  There is mild motion artifact  Stress Images:  Normal homogeneous uptake in all areas of the myocardium. Rest Images:  Normal homogeneous uptake in all areas of the myocardium. Subtraction (SDS):  No evidence of ischemia. Transient Ischemic Dilatation (Normal <1.22):  1.35 Lung/Heart Ratio (Normal <0.45):  0.36  Quantitative Gated Spect Images QGS EDV:  88 ml QGS ESV:  40 ml  Impression Exercise Capacity:  Lexiscan with no exercise. BP Response:  Normal blood pressure response. Clinical Symptoms:  There were typical symptoms with Lexiscan. ECG Impression:  No significant ST segment change suggestive of ischemia. Comparison with Prior Nuclear Study: No images to compare  Overall Impression:  Low risk stress nuclear study with no areas of infarct or ischemia identified.  LV Ejection Fraction: 55%.  LV Wall Motion:  NL LV Function; NL Wall Motion  Donato Schultz, MD

## 2013-12-02 ENCOUNTER — Telehealth: Payer: Self-pay | Admitting: *Deleted

## 2013-12-02 NOTE — Telephone Encounter (Signed)
Message copied by Burnell Blanks on Wed Dec 02, 2013  2:14 PM ------      Message from: Cassell Clement      Created: Wed Dec 02, 2013  1:22 PM       Please report.  The stress test was normal.  No ischemia. EF 55%.  Send copy to Dr. Ruthine Dose in [facility] ------

## 2013-12-02 NOTE — Telephone Encounter (Signed)
Advised patient and will send to Dr Ruthine Dose

## 2015-06-20 IMAGING — CT CT HEAD W/O CM
1 series · 15 of 30 positions shown, 19 images · non-contrast
Comparison: CT of the head performed 05/03/2013

CLINICAL DATA: Weakness, difficulty speaking and difficulty
swallowing on the right side. Diaphoresis and slight facial droop.

EXAM:
CT HEAD WITHOUT CONTRAST
TECHNIQUE: Contiguous axial images were obtained from the base of the skull
through the vertex without intravenous contrast.

[Series 2: head 5.0 h30s · axial · 0.47mm/px · z∈[+415,+575]mm · 15 of 36 slices shown, 19 images]
[im 2/36  brain]
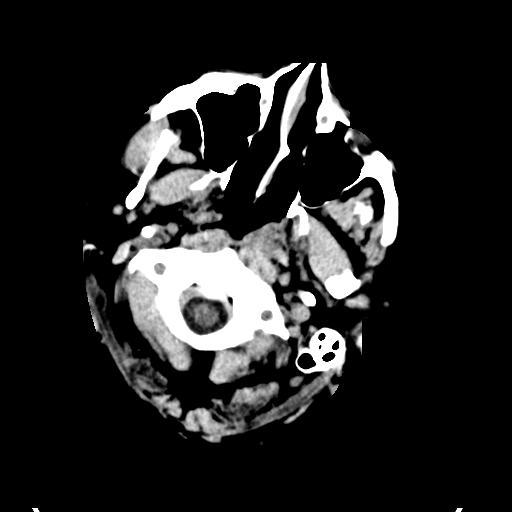
[im 2/36  bone]
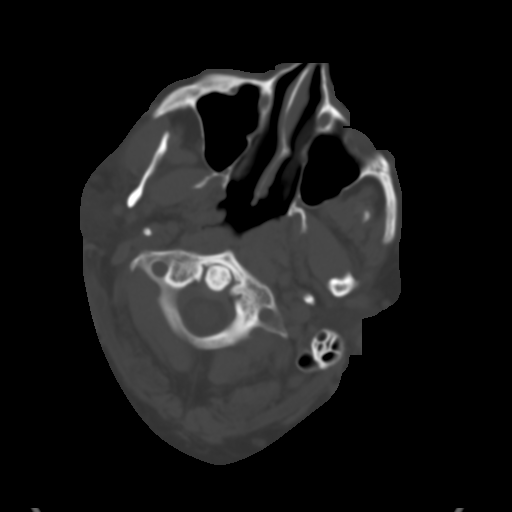
[im 4/36  brain]
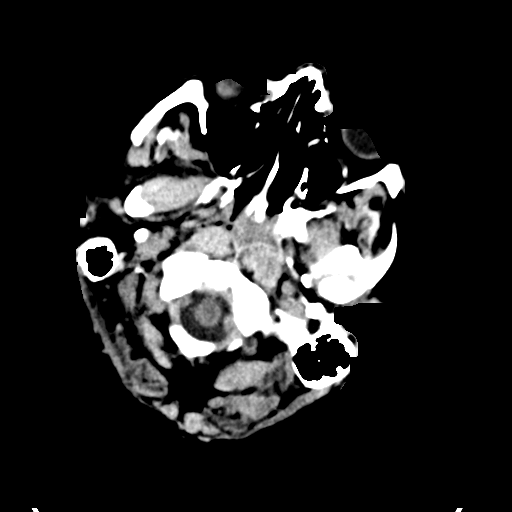
[im 7/36  brain]
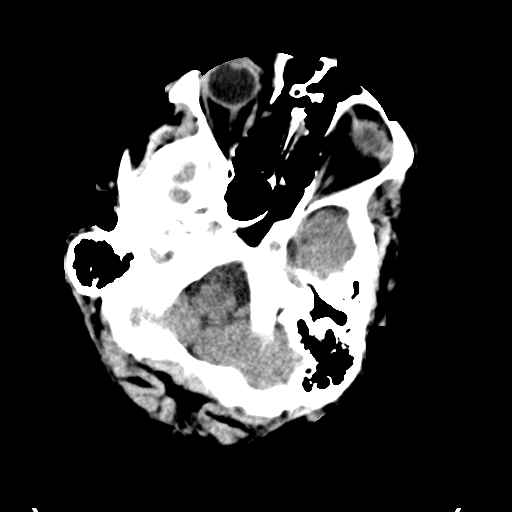
[im 9/36  brain]
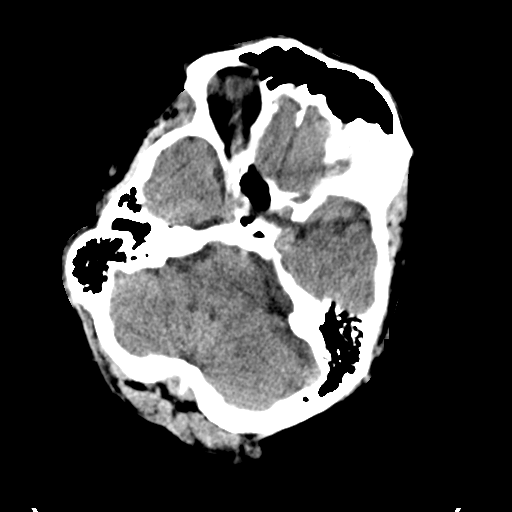
[im 11/36  brain]
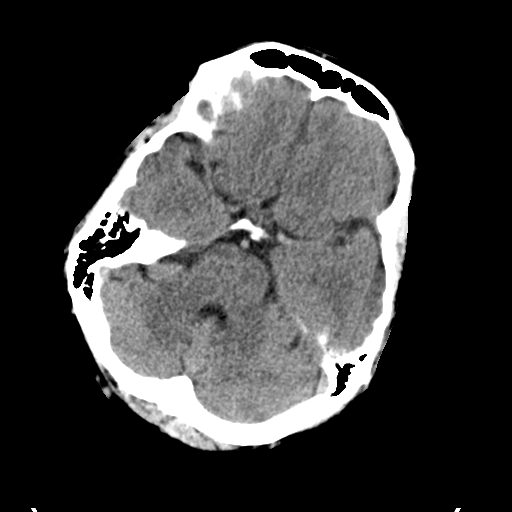
[im 11/36  bone]
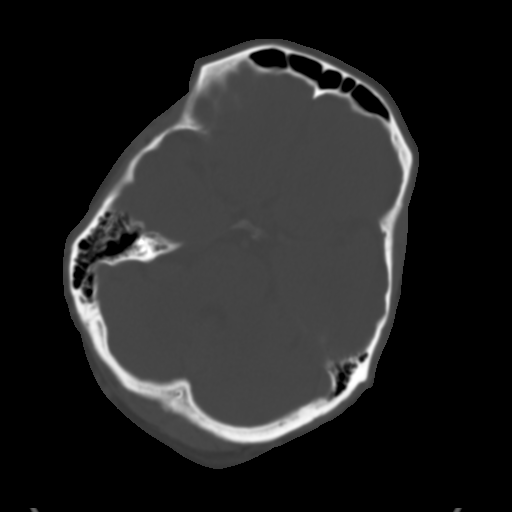
[im 14/36  brain]
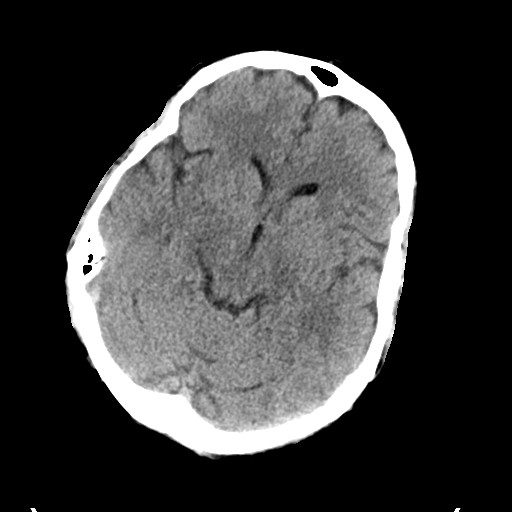
[im 16/36  brain]
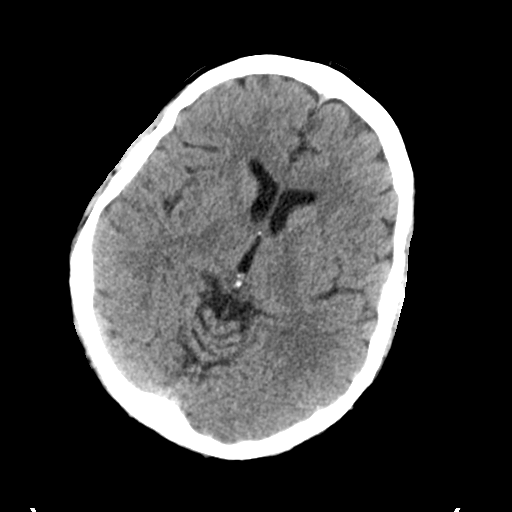
[im 19/36  brain]
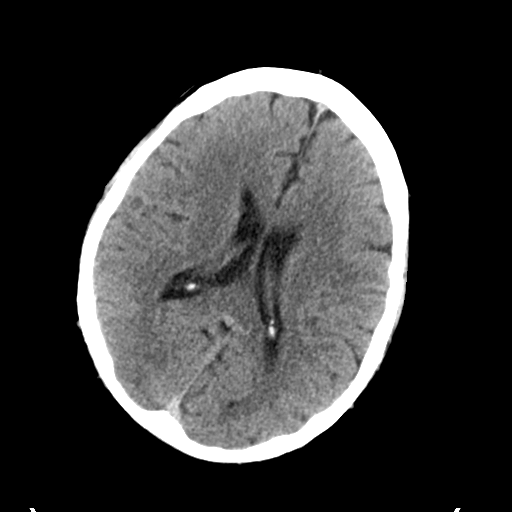
[im 20/36  brain]
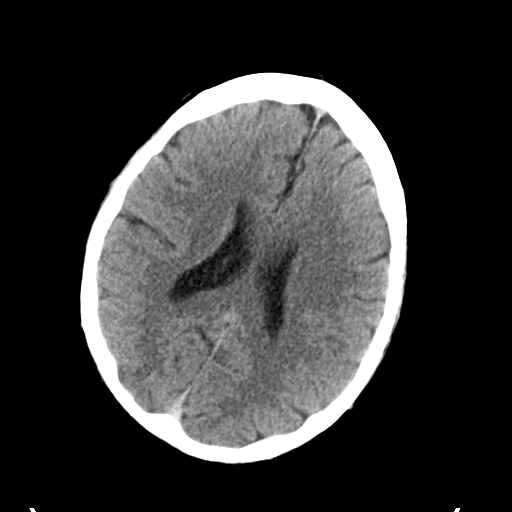
[im 20/36  bone]
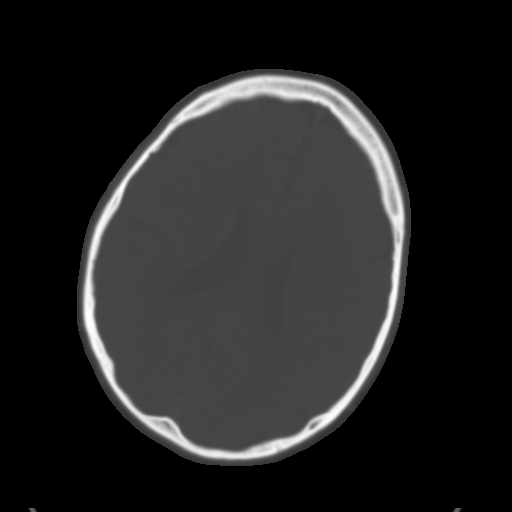
[im 22/36  brain]
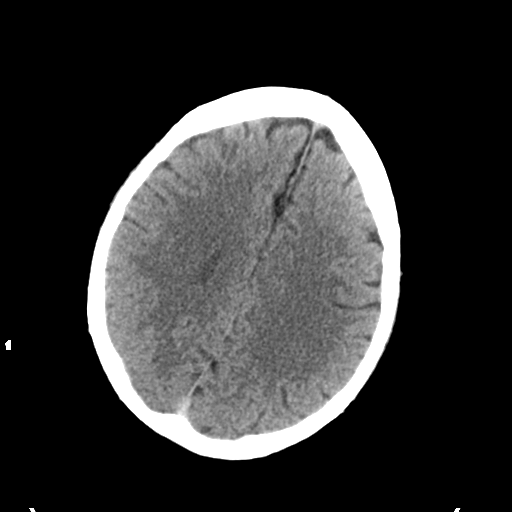
[im 25/36  brain]
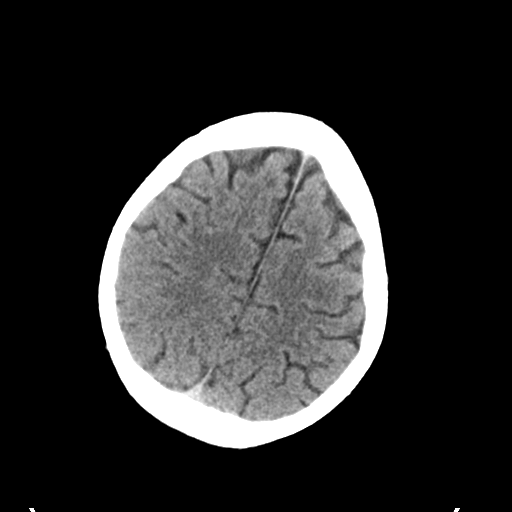
[im 27/36  brain]
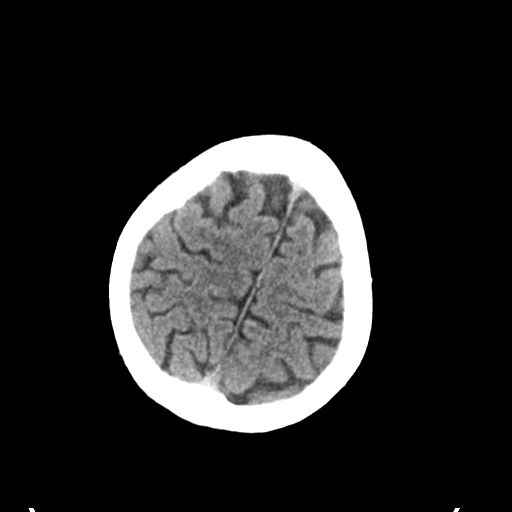
[im 29/36  brain]
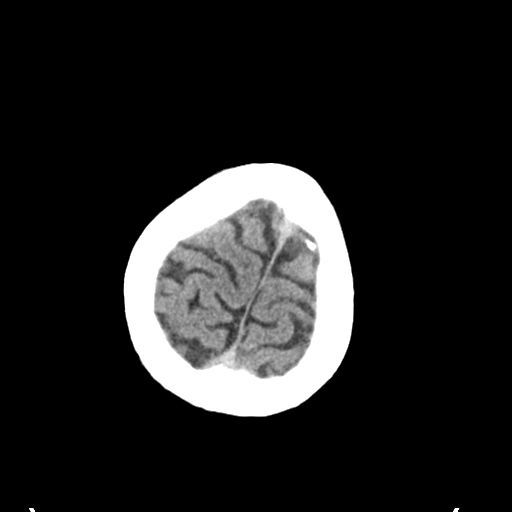
[im 29/36  bone]
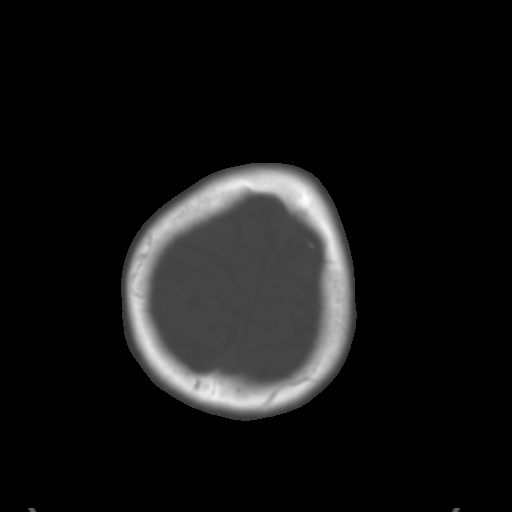
[im 32/36  brain]
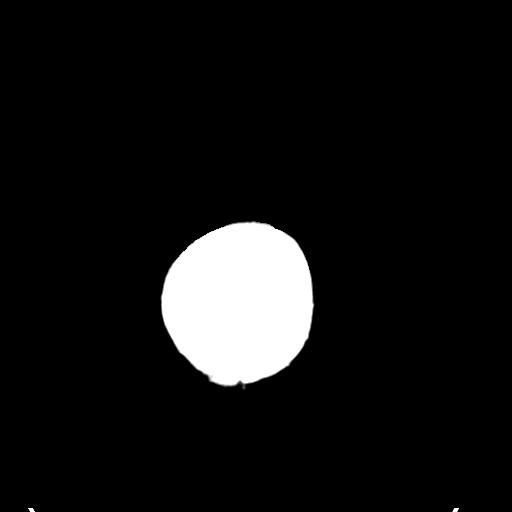
[im 34/36  brain]
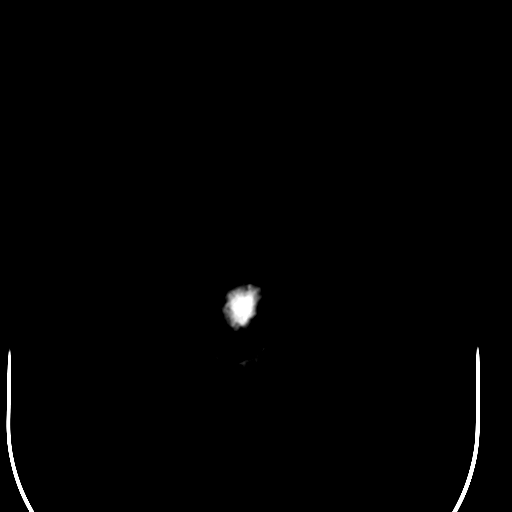

[15 of 30 positions shown; findings below may reference images not displayed]

FINDINGS: There is no evidence of acute infarction, mass lesion, or intra- or
extra-axial hemorrhage on CT.

The posterior fossa, including the cerebellum, brainstem and fourth
ventricle, is within normal limits. The third and lateral
ventricles, and basal ganglia are unremarkable in appearance. The
cerebral hemispheres are symmetric in appearance, with normal
gray-white differentiation. No mass effect or midline shift is seen.

There is no evidence of fracture; visualized osseous structures are
unremarkable in appearance. The orbits are within normal limits.
Mucosal thickening is noted at the left side of the sphenoid sinus.
The remaining paranasal sinuses and mastoid air cells are
well-aerated. No significant soft tissue abnormalities are seen.
IMPRESSION: 1. No acute intracranial pathology seen on CT.
2. Mucosal thickening at the left side of the sphenoid sinus.
